# Patient Record
Sex: Female | Born: 2000
Health system: Southern US, Community
[De-identification: ages and names within clinical notes are randomized; demographics above are authoritative.]

---
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
## 2021-05-18 ENCOUNTER — Other Ambulatory Visit: Payer: Self-pay | Admitting: Family

## 2021-05-18 DIAGNOSIS — N910 Primary amenorrhea: Secondary | ICD-10-CM

## 2021-05-18 MED ORDER — NORETHINDRONE ACET-ETHINYL EST 1.5-30 MG-MCG PO TABS: 1.0000 | ORAL_TABLET | Freq: Every day | ORAL | 3 refills | Status: DC

## 2021-05-20 ENCOUNTER — Other Ambulatory Visit: Payer: Self-pay | Admitting: Family

## 2021-05-20 ENCOUNTER — Telehealth: Payer: Self-pay | Admitting: *Deleted

## 2021-05-20 MED ORDER — NORETHINDRONE ACET-ETHINYL EST 1.5-30 MG-MCG PO TABS: 1.0000 | ORAL_TABLET | Freq: Every day | ORAL | 3 refills | Status: DC

## 2021-05-20 MED ORDER — NORETHINDRONE ACET-ETHINYL EST 1.5-30 MG-MCG PO TABS: 1.0000 | ORAL_TABLET | Freq: Every day | ORAL | 3 refills | Status: AC

## 2021-05-20 NOTE — Telephone Encounter (Signed)
Patient called stating that she takes Aurovela BC but Junel was called in instead. She stated that was the wrong one and she would like the Aurovela be sent to the home delivery pharmacy. Please advise and send the correct BC.

## 2022-06-08 ENCOUNTER — Other Ambulatory Visit: Payer: Self-pay | Admitting: Family
